# Patient Record
Sex: Female | Born: 1995 | Race: White | Hispanic: No | Marital: Married | Smoking: Never smoker
Health system: Southern US, Community
[De-identification: ages and names within clinical notes are randomized; demographics above are authoritative.]

## PROBLEM LIST (undated history)

## (undated) DIAGNOSIS — R109 Unspecified abdominal pain: Secondary | ICD-10-CM

## (undated) DIAGNOSIS — R110 Nausea: Secondary | ICD-10-CM

## (undated) HISTORY — DX: Nausea: R11.0

## (undated) HISTORY — DX: Unspecified abdominal pain: R10.9

---
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
## 2017-03-23 ENCOUNTER — Encounter: Payer: Self-pay | Admitting: Family Medicine

## 2017-03-23 ENCOUNTER — Ambulatory Visit: Payer: No Typology Code available for payment source | Admitting: Family Medicine

## 2017-03-23 ENCOUNTER — Other Ambulatory Visit: Payer: Self-pay

## 2017-03-23 VITALS — BP 110/70 | HR 78 | Temp 98.4°F | Resp 16 | Ht 61.61 in | Wt 156.0 lb

## 2017-03-23 DIAGNOSIS — R21 Rash and other nonspecific skin eruption: Secondary | ICD-10-CM | POA: Diagnosis not present

## 2017-03-23 DIAGNOSIS — Z9109 Other allergy status, other than to drugs and biological substances: Secondary | ICD-10-CM

## 2017-03-23 DIAGNOSIS — M26629 Arthralgia of temporomandibular joint, unspecified side: Secondary | ICD-10-CM | POA: Diagnosis not present

## 2017-03-23 MED ORDER — EPINEPHRINE 0.3 MG/0.3ML IJ SOAJ: 0.3000 mg | Freq: Once | INTRAMUSCULAR | 0 refills | Status: AC

## 2017-03-23 MED ORDER — FLUTICASONE PROPIONATE 50 MCG/ACT NA SUSP: 2.0000 | Freq: Every day | NASAL | 6 refills | Status: DC

## 2017-03-23 MED ORDER — EPINEPHRINE 0.3 MG/0.3ML IJ SOAJ: 0.3000 mg | Freq: Once | INTRAMUSCULAR | 0 refills | Status: DC

## 2017-03-23 NOTE — Patient Instructions (Addendum)
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 See information on stress management to see if that helps. Tylenol PM ok temporarily if needed for sleep, but follow up to discuss those symptoms further in next few weeks. Try over-the-counter ibuprofen 400-600 mg every 6 hours as needed with food. Follow-up in the next 2 weeks if TMJ syndrome symptoms are not improving.   For allergies - continue to take Zyrtec once per day. Start singulair once per day. Fluticasone if needed for nasal symptoms - 1 spray per day. I will refer you to allergist for testing.  If any severe reaction - use the Epi pen and proceed to the ER.   Temporomandibular Joint Syndrome Temporomandibular joint (TMJ) syndrome is a condition that affects the joints between your jaw and your skull. The TMJs are located near your ears and allow your jaw to open and close. These joints and the nearby muscles are involved in all movements of the jaw. People with TMJ syndrome have pain in the area of these joints and muscles. Chewing, biting, or other movements of the jaw can be difficult or painful. TMJ syndrome can be caused by various things. In many cases, the condition is mild and goes away within a few weeks. For some people, the condition can become a long-term problem. What are the causes? Possible causes of TMJ syndrome include:  Grinding your teeth or clenching your jaw. Some people do this when they are under stress.  Arthritis.  Injury to the jaw.  Head or neck injury.  Teeth or dentures that are not aligned well.  In some cases, the cause of TMJ syndrome may not be known. What are the signs or symptoms? The most common symptom is an aching pain on the side of the head in the area of the TMJ. Other symptoms may include:  Pain when moving your jaw, such as when chewing or biting.  Being unable to open your jaw all the way.  Making a clicking sound when you open your mouth.  Headache.  Earache.  Neck or shoulder  pain.  How is this diagnosed? Diagnosis can usually be made based on your symptoms, your medical history, and a physical exam. Your health care provider may check the range of motion of your jaw. Imaging tests, such as X-rays or an MRI, are sometimes done. You may need to see your dentist to determine if your teeth and jaw are lined up correctly. How is this treated? TMJ syndrome often goes away on its own. If treatment is needed, the options may include:  Eating soft foods and applying ice or heat.  Medicines to relieve pain or inflammation.  Medicines to relax the muscles.  A splint, bite plate, or mouthpiece to prevent teeth grinding or jaw clenching.  Relaxation techniques or counseling to help reduce stress.  Transcutaneous electrical nerve stimulation (TENS). This helps to relieve pain by applying an electrical current through the skin.  Acupuncture. This is sometimes helpful to relieve pain.  Jaw surgery. This is rarely needed.  Follow these instructions at home:  Take medicines only as directed by your health care provider.  Eat a soft diet if you are having trouble chewing.  Apply ice to the painful area. ? Put ice in a plastic bag. ? Place a towel between your skin and the bag. ? Leave the ice on for 20 minutes, 2-3 times a day.  Apply a warm compress to the painful area as directed.  Massage your jaw area  and perform any jaw stretching exercises as recommended by your health care provider.  If you were given a mouthpiece or bite plate, wear it as directed.  Avoid foods that require a lot of chewing. Do not chew gum.  Keep all follow-up visits as directed by your health care provider. This is important. Contact a health care provider if:  You are having trouble eating.  You have new or worsening symptoms. Get help right away if:  Your jaw locks open or closed. This information is not intended to replace advice given to you by your health care provider.  Make sure you discuss any questions you have with your health care provider. Document Released: 10/11/2000 Document Revised: 09/16/2015 Document Reviewed: 08/21/2013 Elsevier Interactive Patient Education  2018 [employer].   Allergies, Adult An allergy is when your body's defense system (immune system) overreacts to an otherwise harmless substance (allergen) that you breathe in or eat or something that touches your skin. When you come into contact with something that you are allergic to, your immune system produces certain proteins (antibodies). These proteins cause cells to release chemicals (histamines) that trigger the symptoms of an allergic reaction. Allergies often affect the nasal passages (allergic rhinitis), eyes (allergic conjunctivitis), skin (atopic dermatitis), and stomach. Allergies can be mild or severe. Allergies cannot spread from person to person (are not contagious). They can develop at any age and may be outgrown. What increases the risk? You may be at greater risk of allergies if other people in your family have allergies. What are the signs or symptoms? Symptoms depend on what type of allergy you have. They may include:  Runny, stuffy nose.  Sneezing.  Itchy mouth, ears, or throat.  Postnasal drip.  Sore throat.  Itchy, red, watery, or puffy eyes.  Skin rash or hives.  Stomach pain.  Vomiting.  Diarrhea.  Bloating.  Wheezing or coughing.  People with a severe allergy to food, medicine, or an insect bite may have a life-threatening allergic reaction (anaphylaxis). Symptoms of anaphylaxis include:  Hives.  Itching.  Flushed face.  Swollen lips, tongue, or mouth.  Tight or swollen throat.  Chest pain or tightness in the chest.  Trouble breathing or shortness of breath.  Rapid heartbeat.  Dizziness or fainting.  Vomiting.  Diarrhea.  Pain in the abdomen.  How is this diagnosed? This condition is diagnosed based on:  Your  symptoms.  Your family and medical history.  A physical exam.  You may need to see a health care provider who specializes in treating allergies (allergist). You may also have tests, including:  Skin tests to see which allergens are causing your symptoms, such as: ? Skin prick test. In this test, your skin is pricked with a tiny needle and exposed to small amounts of possible allergens to see if your skin reacts. ? Intradermal skin test. In this test, a small amount of allergen is injected under your skin to see if your skin reacts. ? Patch test. In this test, a small amount of allergen is placed on your skin and then your skin is covered with a bandage. Your health care provider will check your skin after a couple of days to see if a rash has developed.  Blood tests.  Challenges tests. In this test, you inhale a small amount of allergen by mouth to see if you have an allergic reaction.  You may also be asked to:  Keep a food diary. A food diary is a record of all  the foods and drinks you have in a day and any symptoms you experience.  Practice an elimination diet. An elimination diet involves eliminating specific foods from your diet and then adding them back in one by one to find out if a certain food causes an allergic reaction.  How is this treated? Treatment for allergies depends on your symptoms. Treatment may include:  Cold compresses to soothe itching and swelling.  Eye drops.  Nasal sprays.  Using a saline spray or container (neti pot) to flush out the nose (nasal irrigation). These methods can help clear away mucus and keep the nasal passages moist.  Using a humidifier.  Oral antihistamines or other medicines to block allergic reaction and inflammation.  Skin creams to treat rashes or itching.  Diet changes to eliminate food allergy triggers.  Repeated exposure to tiny amounts of allergens to build up a tolerance and prevent future allergic reactions  (immunotherapy). These include: ? Allergy shots. ? Oral treatment. This involves taking small doses of an allergen under the tongue (sublingual immunotherapy).  Emergency epinephrine injection (auto-injector) in case of an allergic emergency. This is a self-injectable, pre-measured medicine that must be given within the first few minutes of a serious allergic reaction.  Follow these instructions at home:  Avoid known allergens whenever possible.  If you suffer from airborne allergens, wash out your nose daily. You can do this with a saline spray or a neti pot to flush out your nose (nasal irrigation).  Take over-the-counter and prescription medicines only as told by your health care provider.  Keep all follow-up visits as told by your health care provider. This is important.  If you are at risk of a severe allergic reaction (anaphylaxis), keep your auto-injector with you at all times.  If you have ever had anaphylaxis, wear a medical alert bracelet or necklace that states you have a severe allergy. Contact a health care provider if:  Your symptoms do not improve with treatment. Get help right away if:  You have symptoms of anaphylaxis, such as: ? Swollen mouth, tongue, or throat. ? Pain or tightness in your chest. ? Trouble breathing or shortness of breath. ? Dizziness or fainting. ? Severe abdominal pain, vomiting, or diarrhea. This information is not intended to replace advice given to you by your health care provider. Make sure you discuss any questions you have with your health care provider. Document Released: 04/11/2002 Document Revised: 05/17/2016 Document Reviewed: 08/04/2015 Elsevier Interactive Patient Education  2018 [employer].    IF you received an x-ray today, you will receive an invoice from [facility]. Please contact [facility] at [phone] with questions or concerns regarding your invoice.   IF you received labwork today, you will  receive an invoice from [place]. Please contact [facility] at [phone] with questions or concerns regarding your invoice.   Our billing staff will not be able to assist you with questions regarding bills from these companies.  You will be contacted with the lab results as soon as they are available. The fastest way to get your results is to activate your My Chart account. Instructions are located on the last page of this paperwork. If you have not heard from [place] regarding the results in 2 weeks, please contact this office.

## 2017-03-23 NOTE — Progress Notes (Signed)
By signing my name below, I, [name], attest that this documentation has been prepared under the direction and in the presence of [name], [name], MD. Electronically Signed: [name], Medical Scribe 03/23/2017 at 3:26 PM. Subjective:    Patient ID: Lisa Gould, female    DOB: 11-24-1995, 21 y.o.   MRN: [id number]  HPI Chief Complaint  Patient presents with  . Allergic Reaction    pt states she thinks she has been having an allergic reaction to certain animals.   . Palpitations    pt states when she is around ginny pigs,dogs or cats her skin gets red and itchy. Pt states she has paplutaions also.    ISRA Gould is a 22 y.o. female is a new pt to me who presents to Primary Care at [facility] complaining of issues as listed below.   Allergies: She has been having allergic reactions to pet dander, including Lisa pigs, cats, and dogs for 1 month. She states after she had a Lisa pig lying on her chest, she had redness to her chest and face, itchy rash on her hands, and hives on her chest, as well as heart palpitations. Her boyfriend states she might have had an anxiety attack after her symptoms began. She takes zyrtec regularly but this has not helped her symptoms. She went to another urgent care 3 weeks ago and was prescribed prednisone (pills), but she had temple swelling and was some mild difficulty breathing (she describes this as a tightness) after starting this. She denies wheezing and congestion (but states when she is around dog fur, she gets "tightness" in her nose. She has no h/o anxiety, allergies, or asthma. She has not tried nasal sprays for this. 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 She has not seen an allergist.   Mouth: She states she has difficulty extending her jaws, with swelling in her temples. She states she has clicking when she eats. She also gets a HA at times. She denies stresses beyond the normal. She is happy. She is not  sleeping well due to being uncomfortable at night. She has not taken tylenol PM for sleep.    Patient Active Problem List   Diagnosis Date Noted  . Simple constipation 04/16/2013  . Family history of Crohn's disease 04/16/2013  . Bilateral lower abdominal pain   . Difficulty swallowing solids    Past Medical History:  Diagnosis Date  . Abdominal pain   . Nausea     No Known Allergies Prior to Admission medications   Medication Sig Start Date End Date Taking? Authorizing Provider  fluticasone (FLOVENT HFA) 110 MCG/ACT inhaler Inhale into the lungs 2 (two) times daily.    [provider]  omeprazole (PRILOSEC) 20 MG capsule Take 1 capsule (20 mg total) by mouth daily. 09/24/13 09/25/14  Jon Gills, MD  ondansetron (ZOFRAN-ODT) 4 MG disintegrating tablet Take 4 mg by mouth every 8 (eight) hours as needed for nausea or vomiting.    [provider]  rizatriptan (MAXALT-MLT) 10 MG disintegrating tablet Take 10 mg by mouth as needed for migraine. May repeat in 2 hours if needed    [provider]  SUMAtriptan (IMITREX) 100 MG tablet Take 100 mg by mouth every 2 (two) hours as needed for migraine or headache. May repeat in 2 hours if headache persists or recurs.    [provider]   Social History   Socioeconomic History  .  Marital status: Single    Spouse name: Not on file  . Number of children: Not on file  . Years of education: Not on file  . Highest education level: Not on file  Social Needs  . Financial resource strain: Not on file  . Food insecurity - worry: Not on file  . Food insecurity - inability: Not on file  . Transportation needs - medical: Not on file  . Transportation needs - non-medical: Not on file  Occupational History  . Not on file  Tobacco Use  . Smoking status: Never Smoker  . Smokeless tobacco: Never Used  Substance and Sexual Activity  . Alcohol use: No  . Drug use: No  . Sexual activity: Not on file  Other Topics  Concern  . Not on file  Social History Narrative   11th grade 2014-2015   There were no vitals filed for this visit.  Review of Systems  HENT: Negative for congestion.   Respiratory: Positive for chest tightness. Negative for wheezing.   Cardiovascular: Positive for palpitations.  Skin: Positive for color change and rash.  Allergic/Immunologic: Positive for environmental allergies.  Neurological: Positive for headaches.  Psychiatric/Behavioral: Positive for sleep disturbance.       Objective:   Physical Exam  Constitutional: She is oriented to person, place, and time. She appears well-developed and well-nourished. No distress.  HENT:  Head: Normocephalic and atraumatic.  Right Ear: Hearing, tympanic membrane, external ear and ear canal normal.  Left Ear: Hearing, tympanic membrane, external ear and ear canal normal.  Nose: Nose normal.  Mouth/Throat: Oropharynx is clear and moist. No oropharyngeal exudate.  Moist mucosa, no intraoral lesions  Eyes: Conjunctivae and EOM are normal. Pupils are equal, round, and reactive to light.  Cardiovascular: Normal rate, regular rhythm, normal heart sounds and intact distal pulses.  No murmur heard. Pulmonary/Chest: Effort normal and breath sounds normal. No respiratory distress. She has no wheezes. She has no rhonchi.  Lung sounds normal.  Neurological: She is alert and oriented to person, place, and time.  Skin: Skin is warm and dry. No rash noted.  Psychiatric: She has a normal mood and affect. Her behavior is normal.  Vitals reviewed.     Assessment & Plan:    BERNETA SCONYERS is a 22 y.o. female Environmental allergies - Plan: Ambulatory referral to Allergy, DISCONTINUED: fluticasone (FLONASE) 50 MCG/ACT nasal spray, DISCONTINUED: EPINEPHrine (EPIPEN 2-PAK) 0.3 mg/0.3 mL IJ SOAJ injection Rash and nonspecific skin eruption  - Possible animal allergies with prior reaction, with reported hives. Doubt airway involvement, possible  panic/anxiety symptoms at the time. However EpiPen will provided if significant reaction occurs in the future. Correct use discussed as well as need for definitive care through emergency room.  - Refer to allergist for testing/treatment.  -Start Flonase nasal spray for nasal allergies, continue Zyrtec daily or Allegra if sedation with Zyrtec  -Start Singulair 10 mg daily.  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 Initial trial of ibuprofen with food, handout given, stress management techniques discussed, and follow-up next few weeks if that is not improving to look at other causes. RTC precautions if worsening sooner.  Meds ordered this encounter  Medications  . DISCONTD: fluticasone (FLONASE) 50 MCG/ACT nasal spray    Sig: Place 2 sprays into both nostrils daily.    Dispense:  16 g    Refill:  6  . DISCONTD: EPINEPHrine (EPIPEN 2-PAK) 0.3 mg/0.3 mL IJ SOAJ injection    Sig: Inject 0.3 mLs (0.3 mg total) into the muscle once for 1 dose.    Dispense:  2 Device    Refill:  0   Patient Instructions    Jaw clicking may be due to TMJ syndrome. See information on stress management to see if that helps. Tylenol PM ok temporarily if needed for sleep, but follow up to discuss those symptoms further in next few weeks. Try over-the-counter ibuprofen 400-600 mg every 6 hours as needed with food. Follow-up in the next 2 weeks if TMJ syndrome symptoms are not improving.   For allergies - continue to take Zyrtec once per day. Start singulair once per day. Fluticasone if needed for nasal symptoms - 1 spray per day. I will refer you to allergist for testing.  If any severe reaction - use the Epi pen and proceed to the ER.   Temporomandibular Joint Syndrome Temporomandibular joint (TMJ) syndrome is a condition that affects the joints between your jaw and your skull. The TMJs are  located near your ears and allow your jaw to open and close. These joints and the nearby muscles are involved in all movements of the jaw. People with TMJ syndrome have pain in the area of these joints and muscles. Chewing, biting, or other movements of the jaw can be difficult or painful. TMJ syndrome can be caused by various things. In many cases, the condition is mild and goes away within a few weeks. For some people, the condition can become a long-term problem. What are the causes? Possible causes of TMJ syndrome include:  Grinding your teeth or clenching your jaw. Some people do this when they are under stress.  Arthritis.  Injury to the jaw.  Head or neck injury.  Teeth or dentures that are not aligned well.  In some cases, the cause of TMJ syndrome may not be known. What are the signs or symptoms? The most common symptom is an aching pain on the side of the head in the area of the TMJ. Other symptoms may include:  Pain when moving your jaw, such as when chewing or biting.  Being unable to open your jaw all the way.  Making a clicking sound when you open your mouth.  Headache.  Earache.  Neck or shoulder pain.  How is this diagnosed? Diagnosis can usually be made based on your symptoms, your medical history, and a physical exam. Your health care provider may check the range of motion of your jaw. Imaging tests, such as X-rays or an MRI, are sometimes done. You may need to see your dentist to determine if your teeth and jaw are lined up correctly. How is this treated? TMJ syndrome often goes away on its own. If treatment is needed, the options may include:  Eating soft foods and applying ice or heat.  Medicines to relieve pain or inflammation.  Medicines to relax the muscles.  A splint, bite plate, or mouthpiece to prevent teeth grinding or jaw clenching.  Relaxation techniques or counseling to help reduce stress.  Transcutaneous electrical nerve stimulation  (TENS). This helps to relieve pain by applying an electrical current through the skin.  Acupuncture. This is sometimes helpful to relieve pain.  Jaw surgery. This is rarely needed.  Follow these instructions at home:  Take medicines only as directed by your health care provider.  Eat a soft diet if you are having trouble chewing.  Apply ice to the painful area. ? Put ice in a plastic bag. ? Place a towel between your skin and the bag. ? Leave the ice on for 20 minutes, 2-3 times a day.  Apply a warm compress to the painful area as directed.  Massage your jaw area and perform any jaw stretching exercises as recommended by your health care provider.  If you were given a mouthpiece or bite plate, wear it as directed.  Avoid foods that require a lot of chewing. Do not chew gum.  Keep all follow-up visits as directed by your health care provider. This is important. Contact a health care provider if:  You are having trouble eating.  You have new or worsening symptoms. Get help right away if:  Your jaw locks open or closed. This information is not intended to replace advice given to you by your health care provider. Make sure you discuss any questions you have with your health care provider. Document Released: 10/11/2000 Document Revised: 09/16/2015 Document Reviewed: 08/21/2013 Elsevier Interactive Patient Education  2018 [employer].   Allergies, Adult An allergy is when your body's defense system (immune system) overreacts to an otherwise harmless substance (allergen) that you breathe in or eat or something that touches your skin. When you come into contact with something that you are allergic to, your immune system produces certain proteins (antibodies). These proteins cause cells to release chemicals (histamines) that trigger the symptoms of an allergic reaction. Allergies often affect the nasal passages (allergic rhinitis), eyes (allergic conjunctivitis), skin (atopic  dermatitis), and stomach. Allergies can be mild or severe. Allergies cannot spread from person to person (are not contagious). They can develop at any age and may be outgrown. What increases the risk? You may be at greater risk of allergies if other people in your family have allergies. What are the signs or symptoms? Symptoms depend on what type of allergy you have. They may include:  Runny, stuffy nose.  Sneezing.  Itchy mouth, ears, or throat.  Postnasal drip.  Sore throat.  Itchy, red, watery, or puffy eyes.  Skin rash or hives.  Stomach pain.  Vomiting.  Diarrhea.  Bloating.  Wheezing or coughing.  People with a severe allergy to food, medicine, or an insect bite may have a life-threatening allergic reaction (anaphylaxis). Symptoms of anaphylaxis include:  Hives.  Itching.  Flushed face.  Swollen lips, tongue, or mouth.  Tight or swollen throat.  Chest pain or tightness in the chest.  Trouble breathing or shortness of breath.  Rapid heartbeat.  Dizziness or fainting.  Vomiting.  Diarrhea.  Pain in the abdomen.  How is this diagnosed? This condition is diagnosed based on:  Your symptoms.  Your family and medical history.  A physical exam.  You may need to see a health care provider who specializes in treating allergies (allergist). You may also have tests, including:  Skin tests to see which allergens are causing your symptoms, such as: ? Skin prick test. In this test, your skin is pricked with a tiny needle and exposed to small amounts of possible allergens to see if your skin reacts. ? Intradermal skin test. In this test, a small amount of allergen is injected under your skin to see if your skin reacts. ? Patch test. In this test, a small amount of allergen is placed on your skin and then your skin is covered with a bandage. Your health care provider will check your skin after a couple of days to see if  a rash has developed.  Blood  tests.  Challenges tests. In this test, you inhale a small amount of allergen by mouth to see if you have an allergic reaction.  You may also be asked to:  Keep a food diary. A food diary is a record of all the foods and drinks you have in a day and any symptoms you experience.  Practice an elimination diet. An elimination diet involves eliminating specific foods from your diet and then adding them back in one by one to find out if a certain food causes an allergic reaction.  How is this treated? Treatment for allergies depends on your symptoms. Treatment may include:  Cold compresses to soothe itching and swelling.  Eye drops.  Nasal sprays.  Using a saline spray or container (neti pot) to flush out the nose (nasal irrigation). These methods can help clear away mucus and keep the nasal passages moist.  Using a humidifier.  Oral antihistamines or other medicines to block allergic reaction and inflammation.  Skin creams to treat rashes or itching.  Diet changes to eliminate food allergy triggers.  Repeated exposure to tiny amounts of allergens to build up a tolerance and prevent future allergic reactions (immunotherapy). These include: ? Allergy shots. ? Oral treatment. This involves taking small doses of an allergen under the tongue (sublingual immunotherapy).  Emergency epinephrine injection (auto-injector) in case of an allergic emergency. This is a self-injectable, pre-measured medicine that must be given within the first few minutes of a serious allergic reaction.  Follow these instructions at home:  Avoid known allergens whenever possible.  If you suffer from airborne allergens, wash out your nose daily. You can do this with a saline spray or a neti pot to flush out your nose (nasal irrigation).  Take over-the-counter and prescription medicines only as told by your health care provider.  Keep all follow-up visits as told by your health care provider. This is  important.  If you are at risk of a severe allergic reaction (anaphylaxis), keep your auto-injector with you at all times.  If you have ever had anaphylaxis, wear a medical alert bracelet or necklace that states you have a severe allergy. Contact a health care provider if:  Your symptoms do not improve with treatment. Get help right away if:  You have symptoms of anaphylaxis, such as: ? Swollen mouth, tongue, or throat. ? Pain or tightness in your chest. ? Trouble breathing or shortness of breath. ? Dizziness or fainting. ? Severe abdominal pain, vomiting, or diarrhea. This information is not intended to replace advice given to you by your health care provider. Make sure you discuss any questions you have with your health care provider. Document Released: 04/11/2002 Document Revised: 05/17/2016 Document Reviewed: 08/04/2015 Elsevier Interactive Patient Education  2018 [employer]    IF you received an x-ray today, you will receive an invoice from [facility]. Please contact [facility] at [phone] with questions or concerns regarding your invoice.   IF you received labwork today, you will receive an invoice from [facility]. Please contact [facility] at [phone] with questions or concerns regarding your invoice.   Our billing staff will not be able to assist you with questions regarding bills from these companies.  You will be contacted with the lab results as soon as they are available. The fastest way to get your results is to activate your My Chart account. Instructions are located on the last page of this paperwork. If you have not heard from us  regarding the results in 2 weeks, please contact this office.       I personally performed the services described in this documentation, which was scribed in my presence. The recorded information has been reviewed and considered for accuracy and completeness, addended by me as needed, and agree with information  above.  Signed,   Meredith Staggers, MD Primary Care at [facility].  03/24/17 3:06 PM

## 2017-03-24 ENCOUNTER — Encounter: Payer: Self-pay | Admitting: Family Medicine

## 2017-03-27 ENCOUNTER — Ambulatory Visit: Payer: Self-pay

## 2017-03-27 NOTE — Telephone Encounter (Signed)
Patient called in with c/o "lightheaded." She says "on Sunday night I was lying down in the bed and felt dizzy, felt like the room was spinning. It finally passed, but I tossed and turned all night. Today I felt a little off, so I got up off the chair and was feeling woozy and weak. I felt shaky and I have pressure on the back of my head. I was wondering if this is side effects from having TMJ?" I advised  What are the signs or symptoms? The most common symptom is an aching pain on the side of the head in the area of the TMJ. Other symptoms may include:  Pain when moving your jaw, such as when chewing or biting.  Being unable to open your jaw all the way.  Making a clicking sound when you open your mouth.  Headache.  Earache.  Neck or shoulder pain. I asked is she dizzy now, she denies. I asked is her HR racing now, she said "it was, but now it's ok." I asked is she taking any new medication that would cause dizziness, she said "the only thing I'm taking is Zyrtec, Flonase, and Ibuprofen." I asked is the zyrtec new, she said "no, I've been taking it about a month now and it didn't make me feel any different." She denies vomiting, nausea, chest pain. According to protocol, see PCP within 3 days, appointment made for Thursday at 11am with Dr. Neva SeatGreene, care advice given, she verbalized understanding.  Reason for Disposition . [1] MILD dizziness (e.g., walking normally) AND [2] has NOT been evaluated by physician for this  (Exception: dizziness caused by heat exposure, sudden standing, or poor fluid intake)  Answer Assessment - Initial Assessment Questions 1. DESCRIPTION: "Describe your dizziness."     Felt off 2. LIGHTHEADED: "Do you feel lightheaded?" (e.g., somewhat faint, woozy, weak upon standing)     Weak upon standing a little 3. VERTIGO: "Do you feel like either you or the room is spinning or tilting?" (i.e. vertigo)     Yes 4. SEVERITY: "How bad is it?"  "Do you feel like you are going  to faint?" "Can you stand and walk?"   - MILD - walking normally   - MODERATE - interferes with normal activities (e.g., work, school)    - SEVERE - unable to stand, requires support to walk, feels like passing out now.      Mild 5. ONSET:  "When did the dizziness begin?"     Sunday night when laying down 6. AGGRAVATING FACTORS: "Does anything make it worse?" (e.g., standing, change in head position)     No 7. HEART RATE: "Can you tell me your heart rate?" "How many beats in 15 seconds?"  (Note: not all patients can do this)       It was earlier when I felt dizzy, but now it's calm 8. CAUSE: "What do you think is causing the dizziness?"     Unknown 9. RECURRENT SYMPTOM: "Have you had dizziness before?" If so, ask: "When was the last time?" "What happened that time?"     No 10. OTHER SYMPTOMS: "Do you have any other symptoms?" (e.g., fever, chest pain, vomiting, diarrhea, bleeding)       Pressure back of head 11. PREGNANCY: "Is there any chance you are pregnant?" "When was your last menstrual period?"       No-LMP 1 week ago  Protocols used: DIZZINESS [facility]- LIGHTHEADEDNESS-A-AH

## 2017-03-29 ENCOUNTER — Ambulatory Visit: Payer: Self-pay | Admitting: Family Medicine

## 2017-04-10 ENCOUNTER — Ambulatory Visit: Payer: No Typology Code available for payment source | Admitting: Family Medicine

## 2017-05-23 ENCOUNTER — Telehealth: Payer: Self-pay | Admitting: Family Medicine

## 2017-05-23 NOTE — Telephone Encounter (Signed)
Copied from CRM [phone]. Topic: Quick Communication - See Telephone Encounter >> May 23, 2017  6:01 PM Trula SladeWalter, Linda F wrote: CRM for notification. See Telephone encounter for: 05/23/17. Patient was diagnosed with TMJ but she has been experiencing sharp pains behind her eyes and headaches.  She wants to know if that is associated with TMJ or is this something new.

## 2017-05-24 NOTE — Telephone Encounter (Signed)
PEC message sent to Dr. Neva SeatGreene re: dx TMJ - having eye pain and headaches.

## 2017-05-24 NOTE — Telephone Encounter (Signed)
Please schedule

## 2017-05-24 NOTE — Telephone Encounter (Signed)
TMJ can cause some localized HA, but not typically eye pain - recommend office visit. ER if acute worsening.

## 2019-05-16 ENCOUNTER — Emergency Department (HOSPITAL_BASED_OUTPATIENT_CLINIC_OR_DEPARTMENT_OTHER)
Admission: EM | Admit: 2019-05-16 | Discharge: 2019-05-17 | Disposition: A | Discharge status: Home / Self Care | Payer: Medicaid Other | Attending: Emergency Medicine | Admitting: Emergency Medicine

## 2019-05-16 ENCOUNTER — Emergency Department (HOSPITAL_BASED_OUTPATIENT_CLINIC_OR_DEPARTMENT_OTHER): Payer: Medicaid Other

## 2019-05-16 ENCOUNTER — Encounter (HOSPITAL_BASED_OUTPATIENT_CLINIC_OR_DEPARTMENT_OTHER): Payer: Self-pay | Admitting: Emergency Medicine

## 2019-05-16 ENCOUNTER — Other Ambulatory Visit: Payer: Self-pay

## 2019-05-16 DIAGNOSIS — Z79899 Other long term (current) drug therapy: Secondary | ICD-10-CM | POA: Insufficient documentation

## 2019-05-16 DIAGNOSIS — K5732 Diverticulitis of large intestine without perforation or abscess without bleeding: Secondary | ICD-10-CM | POA: Insufficient documentation

## 2019-05-16 LAB — CBC WITH DIFFERENTIAL/PLATELET
Abs Immature Granulocytes: 0.08 10*3/uL — ABNORMAL HIGH (ref 0.00–0.07)
Basophils Absolute: 0 10*3/uL (ref 0.0–0.1)
Basophils Relative: 0 %
Eosinophils Absolute: 0.2 10*3/uL (ref 0.0–0.5)
Eosinophils Relative: 1 %
HCT: 37.6 % (ref 36.0–46.0)
Hemoglobin: 12.6 g/dL (ref 12.0–15.0)
Immature Granulocytes: 1 %
Lymphocytes Relative: 17 %
Lymphs Abs: 2.7 10*3/uL (ref 0.7–4.0)
MCH: 29.2 pg (ref 26.0–34.0)
MCHC: 33.5 g/dL (ref 30.0–36.0)
MCV: 87.2 fL (ref 80.0–100.0)
Monocytes Absolute: 1.5 10*3/uL — ABNORMAL HIGH (ref 0.1–1.0)
Monocytes Relative: 9 %
Neutro Abs: 11.6 10*3/uL — ABNORMAL HIGH (ref 1.7–7.7)
Neutrophils Relative %: 72 %
Platelets: 236 10*3/uL (ref 150–400)
RBC: 4.31 MIL/uL (ref 3.87–5.11)
RDW: 12.5 % (ref 11.5–15.5)
WBC: 15.9 10*3/uL — ABNORMAL HIGH (ref 4.0–10.5)
nRBC: 0 % (ref 0.0–0.2)

## 2019-05-16 LAB — URINALYSIS, MICROSCOPIC (REFLEX)

## 2019-05-16 LAB — COMPREHENSIVE METABOLIC PANEL
ALT: 17 U/L (ref 0–44)
AST: 15 U/L (ref 15–41)
Albumin: 4.3 g/dL (ref 3.5–5.0)
Alkaline Phosphatase: 55 U/L (ref 38–126)
Anion gap: 10 (ref 5–15)
BUN: 10 mg/dL (ref 6–20)
CO2: 25 mmol/L (ref 22–32)
Calcium: 9.6 mg/dL (ref 8.9–10.3)
Chloride: 101 mmol/L (ref 98–111)
Creatinine, Ser: 0.75 mg/dL (ref 0.44–1.00)
GFR calc Af Amer: >60 mL/min (ref >60)
GFR calc non Af Amer: >60 mL/min (ref >60)
Glucose, Bld: 102 mg/dL — ABNORMAL HIGH (ref 70–99)
Potassium: 3.4 mmol/L — ABNORMAL LOW (ref 3.5–5.1)
Sodium: 136 mmol/L (ref 135–145)
Total Bilirubin: 0.7 mg/dL (ref 0.3–1.2)
Total Protein: 7.8 g/dL (ref 6.5–8.1)

## 2019-05-16 LAB — PREGNANCY, URINE: Preg Test, Ur: NEGATIVE

## 2019-05-16 LAB — URINALYSIS, ROUTINE W REFLEX MICROSCOPIC
Bilirubin Urine: NEGATIVE
Glucose, UA: NEGATIVE mg/dL
Ketones, ur: NEGATIVE mg/dL
Nitrite: NEGATIVE
Protein, ur: NEGATIVE mg/dL
Specific Gravity, Urine: 1.01 (ref 1.005–1.030)
pH: 7 (ref 5.0–8.0)

## 2019-05-16 LAB — LIPASE, BLOOD: Lipase: 23 U/L (ref 11–51)

## 2019-05-16 MED ORDER — AMOXICILLIN-POT CLAVULANATE 875-125 MG PO TABS
1.0000 | ORAL_TABLET | Freq: Once | ORAL | Status: AC
  Administered 2019-05-16: 1 via ORAL
  Filled 2019-05-16: qty 1

## 2019-05-16 MED ORDER — AMOXICILLIN-POT CLAVULANATE 875-125 MG PO TABS: 1.0000 | ORAL_TABLET | Freq: Three times a day (TID) | ORAL | 0 refills | Status: AC

## 2019-05-16 MED ORDER — SODIUM CHLORIDE 0.9 % IV BOLUS: 1000.0000 mL | Freq: Once | INTRAVENOUS | Status: DC

## 2019-05-16 MED ORDER — KETOROLAC TROMETHAMINE 30 MG/ML IJ SOLN
30.0000 mg | Freq: Once | INTRAMUSCULAR | Status: AC
  Administered 2019-05-16: 30 mg via INTRAVENOUS
  Filled 2019-05-16: qty 1

## 2019-05-16 NOTE — Discharge Instructions (Signed)
Take ibuprofen and/or acetaminophen as needed for fever or pain.  Return if symptoms are getting worse.

## 2019-05-16 NOTE — ED Provider Notes (Signed)
[facility] EMERGENCY DEPARTMENT Provider Note   CSN: [id number] Arrival date & time: 05/16/19  2023   History Chief Complaint  Patient presents with  . Back Pain    Lisa Gould is a 24 y.o. female.  The history is provided by the patient.  Back Pain Has been having pain across the lower abdomen and into the left mid abdomen and left flank over the last 3 days.  Pain is getting worse.  She currently rates pain at 6/10.  There has been some associated nausea but no vomiting.  She started running a fever today and temperature was as high as 100.2.  There were associated chills but no sweats.  She denies any urinary urgency, frequency, tenesmus, dysuria.  She denies constipation or diarrhea.  Last menses was March 17 and she dates she is due for menses female.  She went to an urgent care center earlier today where they found blood in her urine and suggested that she had kidney stones and told her to come here.  At home, she has been taking ibuprofen which has been giving temporary relief.  Past Medical History:  Diagnosis Date  . Abdominal pain   . Nausea     Patient Active Problem List   Diagnosis Date Noted  . Simple constipation 04/16/2013  . Family history of Crohn's disease 04/16/2013  . Bilateral lower abdominal pain   . Difficulty swallowing solids     Past Surgical History:  Procedure Laterality Date  . ADENOIDECTOMY    . TONSILLECTOMY       OB History   No obstetric history on file.     Family History  Problem Relation Age of Onset  . Crohn's disease Sister   . Celiac disease Neg Hx   . Cholelithiasis Neg Hx     Social History   Tobacco Use  . Smoking status: Never Smoker  . Smokeless tobacco: Never Used  Substance Use Topics  . Alcohol use: No  . Drug use: No    Home Medications Prior to Admission medications   Medication Sig Start Date End Date Taking? Authorizing Provider  levocetirizine (XYZAL) 5 MG tablet Take 5 mg by mouth every  evening.   Yes [provider]  cetirizine (ZYRTEC) 10 MG tablet Take 10 mg by mouth daily.    [provider]  fluticasone (FLONASE) 50 MCG/ACT nasal spray Place 2 sprays into both nostrils daily. 03/23/17   Shade Flood, MD  omeprazole (PRILOSEC) 20 MG capsule Take 1 capsule (20 mg total) by mouth daily. 09/24/13 09/25/14  Jon Gills, MD    Allergies    Patient has no known allergies.  Review of Systems   Review of Systems  Musculoskeletal: Positive for back pain.  All other systems reviewed and are negative.   Physical Exam Updated Vital Signs BP 122/83   Pulse (!) 106   Temp 99.2 F (37.3 C) (Oral)   Resp 18   Ht 5\' 1"  (1.549 m)   Wt 70 kg   LMP 04/16/2019   SpO2 99%   BMI 29.16 kg/m   Physical Exam Vitals and nursing note reviewed.   24 year old female, resting comfortably and in no acute distress. Vital signs are significant for slightly elevated heart rate. Oxygen saturation is 99%, which is normal. Head is normocephalic and atraumatic. PERRLA, EOMI. Oropharynx is clear. Neck is nontender and supple without adenopathy or JVD. Back is nontender midline.  There is mild to moderate  left CVA tenderness. Lungs are clear without rales, wheezes, or rhonchi. Chest is nontender. Heart has regular rate and rhythm without murmur. Abdomen is soft, flat, with mild tenderness across the suprapubic area and into the left mid abdomen.  There is no rebound or guarding.  There are no masses or hepatosplenomegaly and peristalsis is hypoactive. Extremities have no cyanosis or edema, full range of motion is present. Skin is warm and dry without rash. Neurologic: Mental status is normal, cranial nerves are intact, there are no motor or sensory deficits.  ED Results / Procedures / Treatments   Labs (all labs ordered are listed, but only abnormal results are displayed) Labs Reviewed  URINALYSIS, ROUTINE W REFLEX MICROSCOPIC - Abnormal; Notable for the following  components:      Result Value   Hgb urine dipstick MODERATE (*)    Leukocytes,Ua TRACE (*)    All other components within normal limits  URINALYSIS, MICROSCOPIC (REFLEX) - Abnormal; Notable for the following components:   Bacteria, UA FEW (*)    All other components within normal limits  COMPREHENSIVE METABOLIC PANEL - Abnormal; Notable for the following components:   Potassium 3.4 (*)    Glucose, Bld 102 (*)    All other components within normal limits  CBC WITH DIFFERENTIAL/PLATELET - Abnormal; Notable for the following components:   WBC 15.9 (*)    Neutro Abs 11.6 (*)    Monocytes Absolute 1.5 (*)    Abs Immature Granulocytes 0.08 (*)    All other components within normal limits  PREGNANCY, URINE  LIPASE, BLOOD   Radiology CT Renal Stone Study  Result Date: 05/16/2019 CLINICAL DATA:  Flank pain, pelvic pain, low back pain, hematuria EXAM: CT ABDOMEN AND PELVIS WITHOUT CONTRAST TECHNIQUE: Multidetector CT imaging of the abdomen and pelvis was performed following the standard protocol without IV contrast. COMPARISON:  None. FINDINGS: Lower chest: No acute abnormality. Hepatobiliary: No solid liver abnormality is seen. No gallstones, gallbladder wall thickening, or biliary dilatation. Pancreas: Unremarkable. No pancreatic ductal dilatation or surrounding inflammatory changes. Spleen: Normal in size without significant abnormality. Adrenals/Urinary Tract: Adrenal glands are unremarkable. Kidneys are normal, without renal calculi, solid lesion, or hydronephrosis. Bladder is unremarkable. Stomach/Bowel: Stomach is within normal limits. Appendix appears normal. There is focal wall thickening of the proximal descending colon about a prominent diverticulum (series 2, image 39). Adjacent fat stranding and fluid in the left paracolic gutter. Vascular/Lymphatic: No significant vascular findings are present. No enlarged abdominal or pelvic lymph nodes. Reproductive: No mass or other significant  abnormality. Fluid attenuation cysts and follicles of the ovaries. Other: No abdominal wall hernia or abnormality. No abdominopelvic ascites. Musculoskeletal: No acute or significant osseous findings. IMPRESSION: 1. There is focal wall thickening of the proximal descending colon about a prominent diverticulum. Adjacent fat stranding and fluid in the left paracolic gutter. Findings are consistent with acute diverticulitis. There is no other significant diverticular disease of the colon. 2.  No evidence of urinary tract calculus or hydronephrosis. Electronically Signed   By: Lauralyn Primes M.D.   On: 05/16/2019 23:36    Procedures Procedures  Medications Ordered in ED Medications  ketorolac (TORADOL) 30 MG/ML injection 30 mg (30 mg Intravenous Given 05/16/19 2357)  amoxicillin-clavulanate (AUGMENTIN) 875-125 MG per tablet 1 tablet (1 tablet Oral Given 05/16/19 2357)    ED Course  I have reviewed the triage vital signs and the nursing notes.  Pertinent labs & imaging results that were available during my care of the patient were reviewed  by me and considered in my medical decision making (see chart for details).  MDM Rules/Calculators/A&P Lower abdominal pain and flank pain of uncertain cause.  Urinalysis obtained here does show moderate hemoglobin, but only 0-5 RBCs per high-power field on microscopic and a slightly contaminated specimen is 6-10..  Only 0-5 WBCs.  Given urinalysis findings, and UTI.  Pain pattern is somewhat atypical for kidney stone, but will send for CT pelvis.  Other possibilities include ovarian cyst, diverticulitis, colitis.  We will also check screening labs.  Old records reviewed, and she has no relevant past visits.  Labs show mild leukocytosis.  CT shows localized area of diverticulitis.  She is given a dose of amoxicillin-clavulanic acid and discharged with prescription for same.  Recommended she continue using over-the-counter analgesics as needed.  Return precautions  discussed.  Final Clinical Impression(s) / ED Diagnoses Final diagnoses:  Diverticulitis of sigmoid colon    Rx / DC Orders ED Discharge Orders         Ordered    amoxicillin-clavulanate (AUGMENTIN) 875-125 MG tablet  3 times daily     05/16/19 5929           Delora Fuel, MD 24/46/28 2358

## 2019-05-16 NOTE — ED Triage Notes (Signed)
Patient presents with lower back pain, left flank pain, fever onset 3 days. Patient seen at urgent care today for same and sent to ED for eval. States some nausea. Denies vomiting or diarrhea.

## 2019-06-15 ENCOUNTER — Telehealth: Payer: Medicaid Other | Admitting: Nurse Practitioner

## 2019-06-15 DIAGNOSIS — R110 Nausea: Secondary | ICD-10-CM

## 2019-06-15 DIAGNOSIS — K5792 Diverticulitis of intestine, part unspecified, without perforation or abscess without bleeding: Secondary | ICD-10-CM

## 2019-06-16 MED ORDER — ONDANSETRON HCL 4 MG PO TABS: 4.0000 mg | ORAL_TABLET | Freq: Three times a day (TID) | ORAL | 0 refills | Status: AC | PRN

## 2019-06-16 MED ORDER — CIPROFLOXACIN HCL 500 MG PO TABS: 500.0000 mg | ORAL_TABLET | Freq: Two times a day (BID) | ORAL | 0 refills | Status: AC

## 2019-06-16 MED ORDER — METRONIDAZOLE 500 MG PO TABS: 500.0000 mg | ORAL_TABLET | Freq: Two times a day (BID) | ORAL | 0 refills | Status: AC

## 2019-06-16 NOTE — Progress Notes (Signed)
We are sorry that you are not feeling well.  Here is how we plan to help!  Based on what you have shared with me it looks like you have acute diverticulitis. As you know diverticulitis is inflammation and infection in out pouching of your colon. Thi I usually flared up by eating food with mall seed or undigestible skin.    I have prescribed Cipro 500 mg twice a day for seven days and flagyl 500mg  BID for 7 days. I alo sent in zofran for nauea.  HOME CARE  We recommend changing your diet to help with your symptoms for the next few days.  Drink plenty of fluids that contain water salt and sugar. Sports drinks such as Gatorade may help.   You may try broths, soups, bananas, applesauce, soft breads, mashed potatoes or crackers.   You are considered infectious for as long as the diarrhea continues. Hand washing or use of alcohol based hand sanitizers is recommend.  It is best to stay out of work or school until your symptoms stop.   GET HELP RIGHT AWAY  If you have dark yellow colored urine or do not pass urine frequently you should drink more fluids.    If your symptoms worsen   If you feel like you are going to pass out (faint)  You have a new problem  MAKE SURE YOU   Understand these instructions.  Will watch your condition.  Will get help right away if you are not doing well or get worse.  Your e-visit answers were reviewed by a board certified advanced clinical practitioner to complete your personal care plan.  Depending on the condition, your plan could have included both over the counter or prescription medications.  If there is a problem please reply  once you have received a response from your provider.  Your safety is important to .  If you have drug allergies check your prescription carefully.    You can use MyChart to ask questions about today's visit, request a non-urgent call back, or ask for a work or school excuse for 24 hours related to this e-Visit. If it has  been greater than 24 hours you will need to follow up with your provider, or enter a new e-Visit to address those concerns.   You will get an e-mail in the next two days asking about your experience.  I hope that your e-visit has been valuable and will speed your recovery. Thank you for using e-visits.  5-10 minutes spent reviewing and documenting in chart.

## 2019-06-17 MED ORDER — SULFAMETHOXAZOLE-TRIMETHOPRIM 800-160 MG PO TABS: 1.0000 | ORAL_TABLET | Freq: Two times a day (BID) | ORAL | 0 refills | Status: AC

## 2019-06-17 NOTE — Addendum Note (Signed)
Addended by: Sebastian Ache on: 06/17/2019 03:28 PM   Modules accepted: Orders

## 2021-10-05 IMAGING — CT CT RENAL STONE PROTOCOL
2 of 4 series · 16 of 46 positions shown, 18 images · non-contrast
Comparison: None.

CLINICAL DATA: Flank pain, pelvic pain, low back pain, hematuria

EXAM:
CT ABDOMEN AND PELVIS WITHOUT CONTRAST
TECHNIQUE: Multidetector CT imaging of the abdomen and pelvis was performed
following the standard protocol without IV contrast.

[Series 2: axial st · axial · 0.98mm/px · z∈[-431,+49]mm · 13 of 106 slices shown, 15 images]
[im 5/106  soft-tissue]
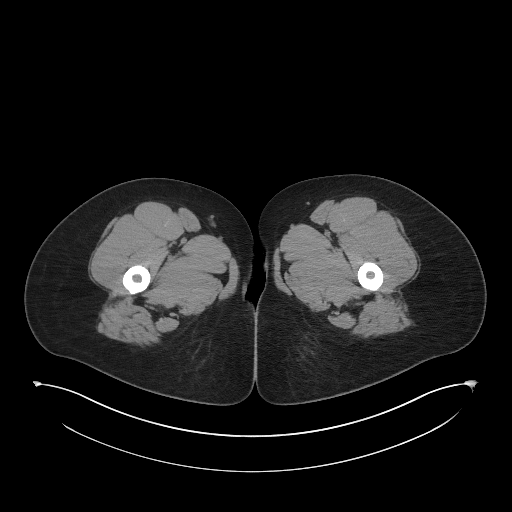
[im 5/106  bone]
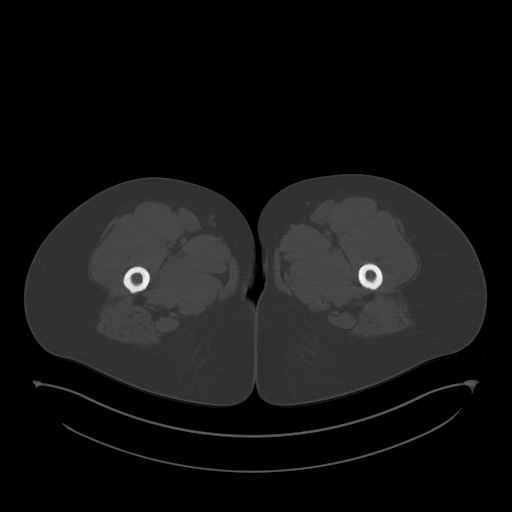
[im 13/106  soft-tissue]
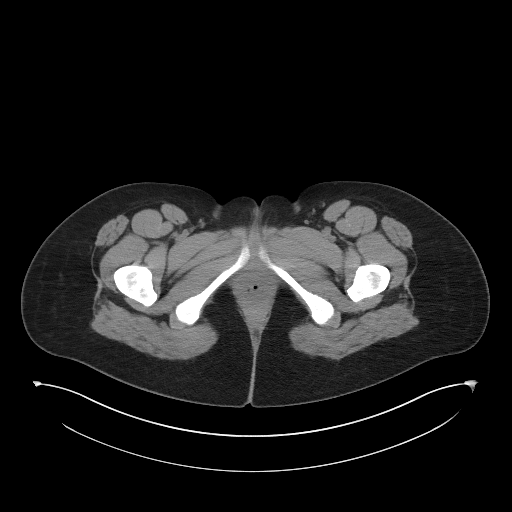
[im 22/106  soft-tissue]
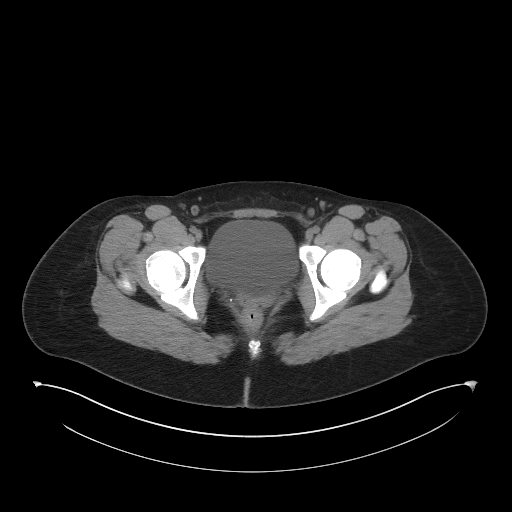
[im 30/106  soft-tissue]
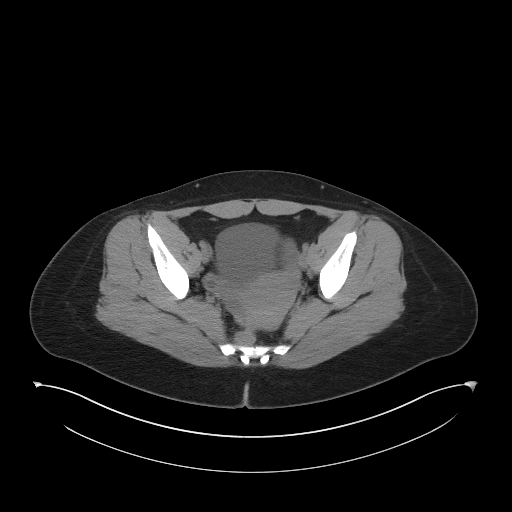
[im 38/106  soft-tissue]
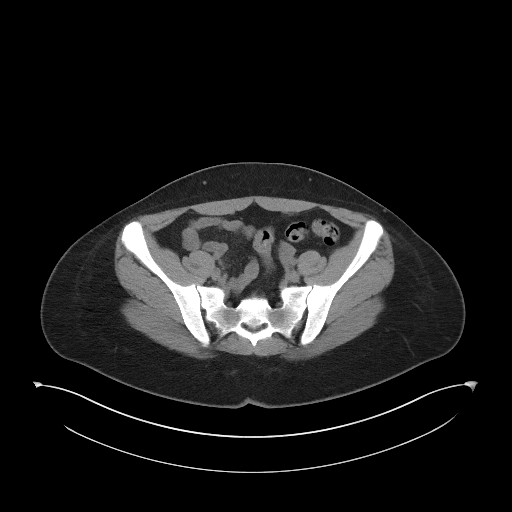
[im 47/106  soft-tissue]
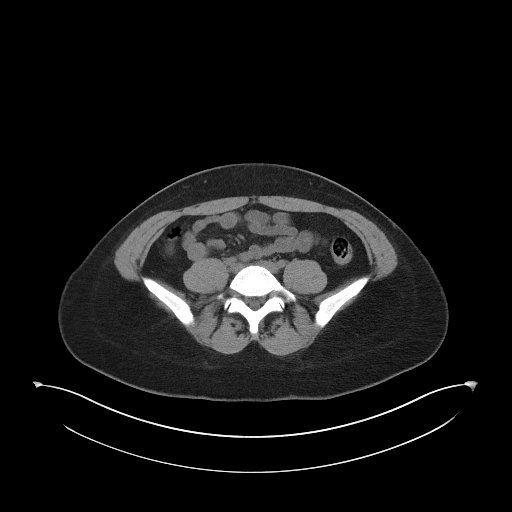
[im 55/106  soft-tissue]
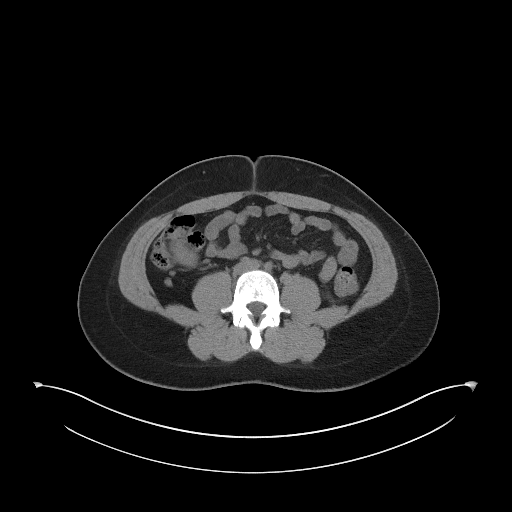
[im 59/106  soft-tissue]
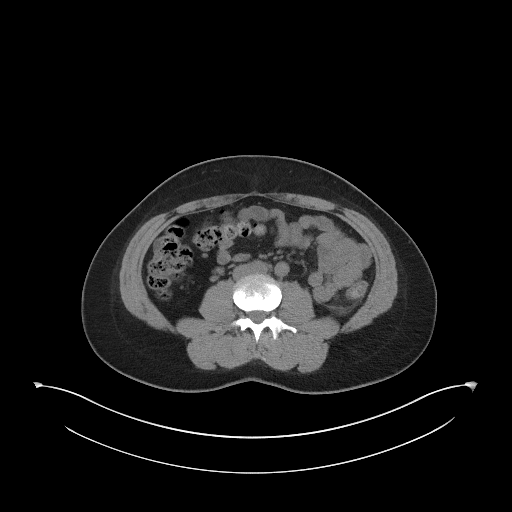
[im 68/106  soft-tissue]
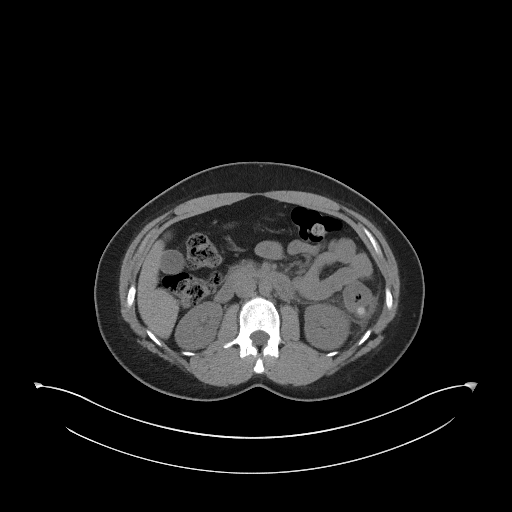
[im 68/106  bone]
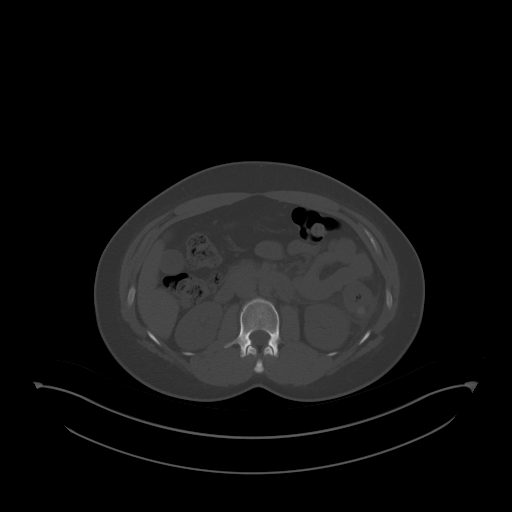
[im 76/106  soft-tissue]
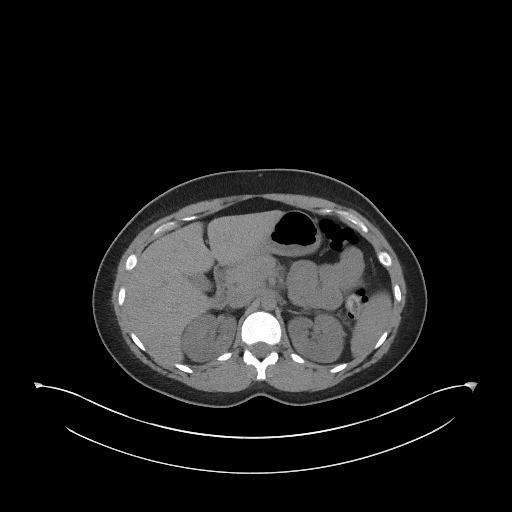
[im 85/106  soft-tissue]
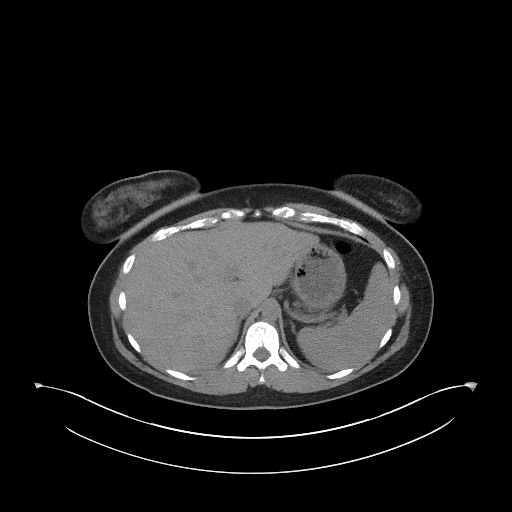
[im 93/106  soft-tissue]
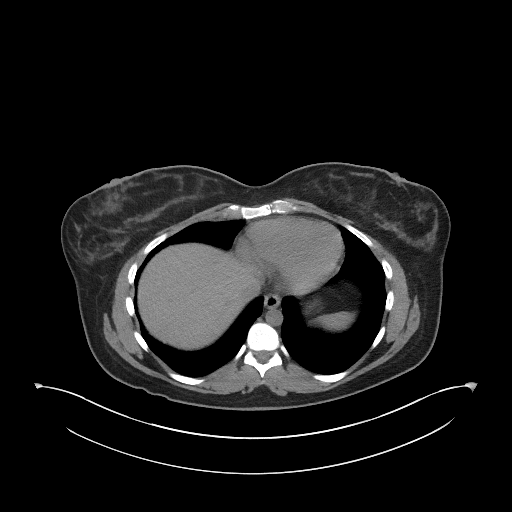
[im 101/106  soft-tissue]
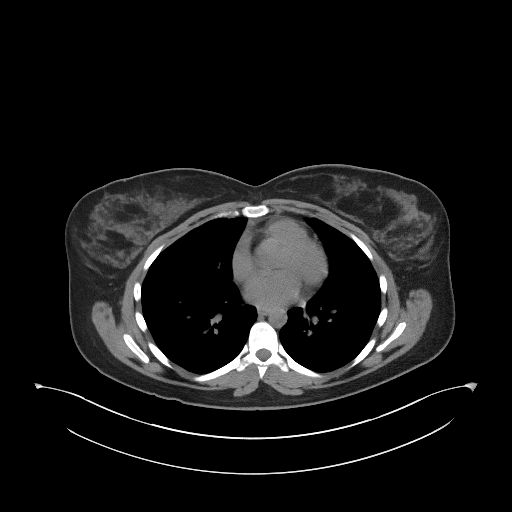

[Series 5: coronal st · coronal · 0.85mm/px · 3 of 100 slices shown]
[im 34/100  soft-tissue]
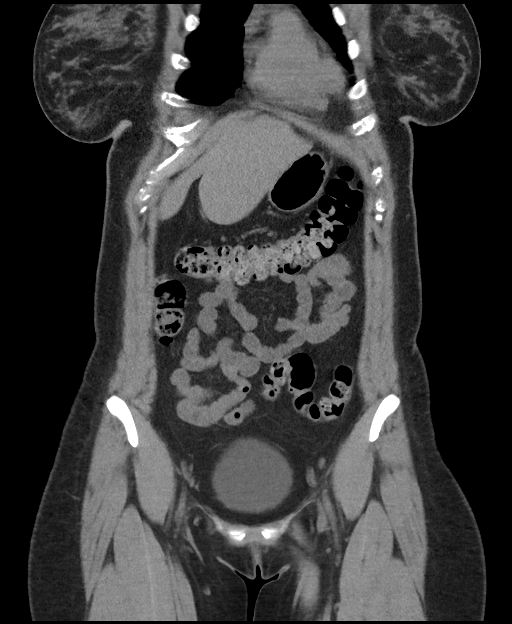
[im 45/100  soft-tissue]
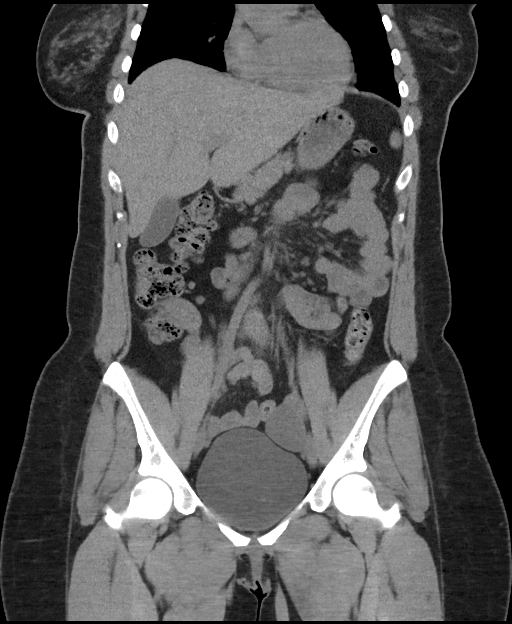
[im 56/100  soft-tissue]
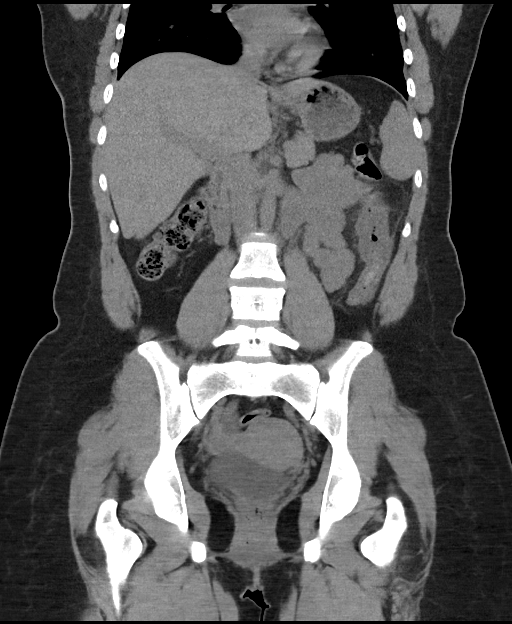

[16 of 46 positions shown; findings below may reference images not displayed]

FINDINGS: Lower chest: No acute abnormality.

Hepatobiliary: No solid liver abnormality is seen. No gallstones,
gallbladder wall thickening, or biliary dilatation.

Pancreas: Unremarkable. No pancreatic ductal dilatation or
surrounding inflammatory changes.

Spleen: Normal in size without significant abnormality.

Adrenals/Urinary Tract: Adrenal glands are unremarkable. Kidneys are
normal, without renal calculi, solid lesion, or hydronephrosis.
Bladder is unremarkable.

Stomach/Bowel: Stomach is within normal limits. Appendix appears
normal. There is focal wall thickening of the proximal descending
colon about a prominent diverticulum (series 2, image 39). Adjacent
fat stranding and fluid in the left paracolic gutter.

Vascular/Lymphatic: No significant vascular findings are present. No
enlarged abdominal or pelvic lymph nodes.

Reproductive: No mass or other significant abnormality. Fluid
attenuation cysts and follicles of the ovaries.

Other: No abdominal wall hernia or abnormality. No abdominopelvic
ascites.

Musculoskeletal: No acute or significant osseous findings.
IMPRESSION: 1. There is focal wall thickening of the proximal descending colon
about a prominent diverticulum. Adjacent fat stranding and fluid in
the left paracolic gutter. Findings are consistent with acute
diverticulitis. There is no other significant diverticular disease
of the colon.

2.  No evidence of urinary tract calculus or hydronephrosis.

## 2021-12-30 DIAGNOSIS — Z419 Encounter for procedure for purposes other than remedying health state, unspecified: Secondary | ICD-10-CM | POA: Diagnosis not present

## 2022-01-30 DIAGNOSIS — Z419 Encounter for procedure for purposes other than remedying health state, unspecified: Secondary | ICD-10-CM | POA: Diagnosis not present

## 2022-03-02 DIAGNOSIS — Z419 Encounter for procedure for purposes other than remedying health state, unspecified: Secondary | ICD-10-CM | POA: Diagnosis not present

## 2022-03-17 ENCOUNTER — Telehealth: Payer: Self-pay

## 2022-03-17 NOTE — Telephone Encounter (Signed)
Mychart msg sent. AS, CMA

## 2022-03-31 DIAGNOSIS — Z419 Encounter for procedure for purposes other than remedying health state, unspecified: Secondary | ICD-10-CM | POA: Diagnosis not present

## 2022-05-01 DIAGNOSIS — Z419 Encounter for procedure for purposes other than remedying health state, unspecified: Secondary | ICD-10-CM | POA: Diagnosis not present

## 2022-05-31 DIAGNOSIS — Z419 Encounter for procedure for purposes other than remedying health state, unspecified: Secondary | ICD-10-CM | POA: Diagnosis not present

## 2022-07-01 DIAGNOSIS — Z419 Encounter for procedure for purposes other than remedying health state, unspecified: Secondary | ICD-10-CM | POA: Diagnosis not present

## 2022-07-31 DIAGNOSIS — Z419 Encounter for procedure for purposes other than remedying health state, unspecified: Secondary | ICD-10-CM | POA: Diagnosis not present

## 2022-08-31 DIAGNOSIS — Z419 Encounter for procedure for purposes other than remedying health state, unspecified: Secondary | ICD-10-CM | POA: Diagnosis not present

## 2022-10-01 DIAGNOSIS — Z419 Encounter for procedure for purposes other than remedying health state, unspecified: Secondary | ICD-10-CM | POA: Diagnosis not present

## 2022-10-31 DIAGNOSIS — Z419 Encounter for procedure for purposes other than remedying health state, unspecified: Secondary | ICD-10-CM | POA: Diagnosis not present

## 2022-12-01 DIAGNOSIS — Z419 Encounter for procedure for purposes other than remedying health state, unspecified: Secondary | ICD-10-CM | POA: Diagnosis not present

## 2022-12-31 DIAGNOSIS — Z419 Encounter for procedure for purposes other than remedying health state, unspecified: Secondary | ICD-10-CM | POA: Diagnosis not present

## 2023-01-31 DIAGNOSIS — Z419 Encounter for procedure for purposes other than remedying health state, unspecified: Secondary | ICD-10-CM | POA: Diagnosis not present

## 2023-03-03 DIAGNOSIS — Z419 Encounter for procedure for purposes other than remedying health state, unspecified: Secondary | ICD-10-CM | POA: Diagnosis not present

## 2023-03-31 DIAGNOSIS — Z419 Encounter for procedure for purposes other than remedying health state, unspecified: Secondary | ICD-10-CM | POA: Diagnosis not present

## 2023-05-12 DIAGNOSIS — Z419 Encounter for procedure for purposes other than remedying health state, unspecified: Secondary | ICD-10-CM | POA: Diagnosis not present

## 2023-06-11 DIAGNOSIS — Z419 Encounter for procedure for purposes other than remedying health state, unspecified: Secondary | ICD-10-CM | POA: Diagnosis not present

## 2023-07-12 DIAGNOSIS — Z419 Encounter for procedure for purposes other than remedying health state, unspecified: Secondary | ICD-10-CM | POA: Diagnosis not present

## 2023-08-11 DIAGNOSIS — Z419 Encounter for procedure for purposes other than remedying health state, unspecified: Secondary | ICD-10-CM | POA: Diagnosis not present

## 2023-09-11 DIAGNOSIS — Z419 Encounter for procedure for purposes other than remedying health state, unspecified: Secondary | ICD-10-CM | POA: Diagnosis not present

## 2023-10-12 DIAGNOSIS — Z419 Encounter for procedure for purposes other than remedying health state, unspecified: Secondary | ICD-10-CM | POA: Diagnosis not present

## 2024-07-18 ENCOUNTER — Inpatient Hospital Stay (HOSPITAL_COMMUNITY): Admission: AD | Admit: 2024-07-18 | Source: Home / Self Care | Admitting: Obstetrics and Gynecology
